# Patient Record
Sex: Male | Born: 1985 | Race: Black or African American | Hispanic: No | Marital: Married | Smoking: Never smoker
Health system: Southern US, Community
[De-identification: ages and names within clinical notes are randomized; demographics above are authoritative.]

---

## 2021-03-14 ENCOUNTER — Other Ambulatory Visit: Payer: Self-pay

## 2021-03-14 ENCOUNTER — Emergency Department: Payer: 59

## 2021-03-14 ENCOUNTER — Encounter: Payer: Self-pay | Admitting: Emergency Medicine

## 2021-03-14 ENCOUNTER — Emergency Department
Admission: EM | Admit: 2021-03-14 | Discharge: 2021-03-14 | Disposition: A | Discharge status: Home / Self Care | Payer: 59 | Attending: Emergency Medicine | Admitting: Emergency Medicine

## 2021-03-14 DIAGNOSIS — R109 Unspecified abdominal pain: Secondary | ICD-10-CM | POA: Diagnosis not present

## 2021-03-14 DIAGNOSIS — R52 Pain, unspecified: Secondary | ICD-10-CM

## 2021-03-14 DIAGNOSIS — M24852 Other specific joint derangements of left hip, not elsewhere classified: Secondary | ICD-10-CM | POA: Insufficient documentation

## 2021-03-14 DIAGNOSIS — M25552 Pain in left hip: Secondary | ICD-10-CM | POA: Diagnosis present

## 2021-03-14 DIAGNOSIS — M25852 Other specified joint disorders, left hip: Secondary | ICD-10-CM

## 2021-03-14 MED ORDER — OXYCODONE-ACETAMINOPHEN 7.5-325 MG PO TABS: 1.0000 | ORAL_TABLET | Freq: Four times a day (QID) | ORAL | 0 refills | Status: AC | PRN

## 2021-03-14 NOTE — ED Provider Notes (Signed)
[facility] Emergency Department Provider Note   ____________________________________________   Event Date/Time   First MD Initiated Contact with Patient 03/14/21 [phone]     (approximate)  I have reviewed the triage vital signs and the nursing notes.   HISTORY  Chief Complaint Leg Pain    HPI Jim Ross is a 36 y.o. male patient complain of radicular pain from the left hip to mid thigh.  Patient onset of complaint few weeks ago.  Patient was seen for back pain initially and given Medrol Dosepak which relieved the back pain.  Left hip and thigh pain continues.  Patient stated mild to moderate relief using a TENS unit that he borrowed from his sister.           History reviewed. No pertinent past medical history.  There are no problems to display for this patient.   History reviewed. No pertinent surgical history.  Prior to Admission medications   Medication Sig Start Date End Date Taking? Authorizing Provider  oxyCODONE-acetaminophen (PERCOCET) 7.5-325 MG tablet Take 1 tablet by mouth every 6 (six) hours as needed for up to 5 days. 03/14/21 03/19/21 Yes Joni Reining, PA-C    Allergies Patient has no known allergies.  No family history on file.  Social History Social History   Tobacco Use   Smoking status: Never   Smokeless tobacco: Never  Substance Use Topics   Alcohol use: Never   Drug use: Never    Review of Systems Constitutional: No fever/chills Eyes: No visual changes. ENT: No sore throat. Cardiovascular: Denies chest pain. Respiratory: Denies shortness of breath. Gastrointestinal: No abdominal pain.  No nausea, no vomiting.  No diarrhea.  No constipation. Genitourinary: Negative for dysuria. Musculoskeletal: Negative for back pain.  Left hip and upper leg pain. Skin: Negative for rash. Neurological: Negative for headaches, focal weakness or numbness.  ____________________________________________   PHYSICAL  EXAM:  VITAL SIGNS: ED Triage Vitals  Enc Vitals Group     BP 03/14/21 0640 122/81     Pulse Rate 03/14/21 0640 81     Resp 03/14/21 0640 18     Temp 03/14/21 0640 98.6 F (37 C)     Temp Source 03/14/21 0640 Oral     SpO2 03/14/21 0640 98 %     Weight 03/14/21 0643 270 lb (122.5 kg)     Height 03/14/21 0643 6\' 1"  (1.854 m)     Head Circumference --      Peak Flow --      Pain Score 03/14/21 0701 7     Pain Loc --      Pain Edu? --      Excl. in GC? --     Constitutional: Alert and oriented. Well appearing and in no acute distress. Cardiovascular: Normal rate, regular rhythm. Grossly normal heart sounds.  Good peripheral circulation. Respiratory: Normal respiratory effort.  No retractions. Lungs CTAB. Gastrointestinal: Soft and nontender. No distention. No abdominal bruits. No CVA tenderness. Genitourinary: Deferred Musculoskeletal: No obvious deformity to left lower extremity.  No ligament discrepancy.  Patient has moderate guarding palpation at the greater trochanter and mid area thigh.. Neurologic:  Normal speech and language. No gross focal neurologic deficits are appreciated. No gait instability. Skin:  Skin is warm, dry and intact. No rash noted. Psychiatric: Mood and affect are normal. Speech and behavior are normal.  ____________________________________________   LABS (all labs ordered are listed, but only abnormal results are displayed)  Labs Reviewed - No data to  display ____________________________________________  EKG   ____________________________________________  RADIOLOGY I, Joni Reining, personally viewed and evaluated these images (plain radiographs) as part of my medical decision making, as well as reviewing the written report by the radiologist.  ED MD interpretation: No obvious deformity on x-ray of the left hip.  Official radiology report(s): DG HIP UNILAT WITH PELVIS 2-3 VIEWS LEFT  Result Date: 03/14/2021 CLINICAL DATA:  Left hip and thigh  pain. EXAM: DG HIP (WITH OR WITHOUT PELVIS) 2-3V LEFT COMPARISON:  None. FINDINGS: Normal bone mineralization. Joint spaces are preserved. Punctate superior left acetabular os acetabuli, possibly developmental versus secondary to femoroacetabular impingement. There is slight increased prominence of the anterior superior left femoral head-neck junction. No acute fracture or dislocation. IMPRESSION: Slightly increased prominence of the left femoral head-neck junction mildly increases risk for CAM-type femoroacetabular impingement. Electronically Signed   By: Neita Garnet M.D.   On: 03/14/2021 08:39    ____________________________________________   PROCEDURES  Procedure(s) performed (including Critical Care):  Procedures   ____________________________________________   INITIAL IMPRESSION / ASSESSMENT AND PLAN / ED COURSE  As part of my medical decision making, I reviewed the following data within the electronic MEDICAL RECORD NUMBER         Patient presents with left hip and right flank pain for few weeks.  Discussed x-ray findings with patient suggestive of impingement.  Patient will be consulted to orthopedic for definitive evaluation and treatment.      ____________________________________________   FINAL CLINICAL IMPRESSION(S) / ED DIAGNOSES  Final diagnoses:  Pain aggravated by activities of daily living  Femoroacetabular impingement of left hip     ED Discharge Orders          Ordered    oxyCODONE-acetaminophen (PERCOCET) 7.5-325 MG tablet  Every 6 hours PRN        03/14/21 0907             Note:  This document was prepared using Dragon voice recognition software and may include unintentional dictation errors.    Joni Reining, PA-C 03/14/21 6834    Dionne Bucy, MD 03/14/21 1336

## 2021-03-14 NOTE — ED Triage Notes (Signed)
Pt to ED from home c/o left leg thigh pain for a couple weeks.  Denies known injury to thigh but did have back injury prior to leg hurting.

## 2021-03-14 NOTE — Discharge Instructions (Signed)
Read and follow discharge care instructions.  Take medication as needed.  Follow-up with orthopedic for definitive evaluation and treatment.  When calling the clinic tell them you are a follow-up from emergency room.

## 2023-01-13 IMAGING — CR DG HIP (WITH OR WITHOUT PELVIS) 2-3V*L*
1 series · 3 of 3 positions shown · non-contrast
Comparison: None.

CLINICAL DATA: Left hip and thigh pain.

EXAM:
DG HIP (WITH OR WITHOUT PELVIS) 2-3V LEFT

[Series 1: dg hip unilat w or w/o pelvis 2-3 views  · non-contrast · 0.14mm/px · 3 of 3 slices shown]
[im 1/3]
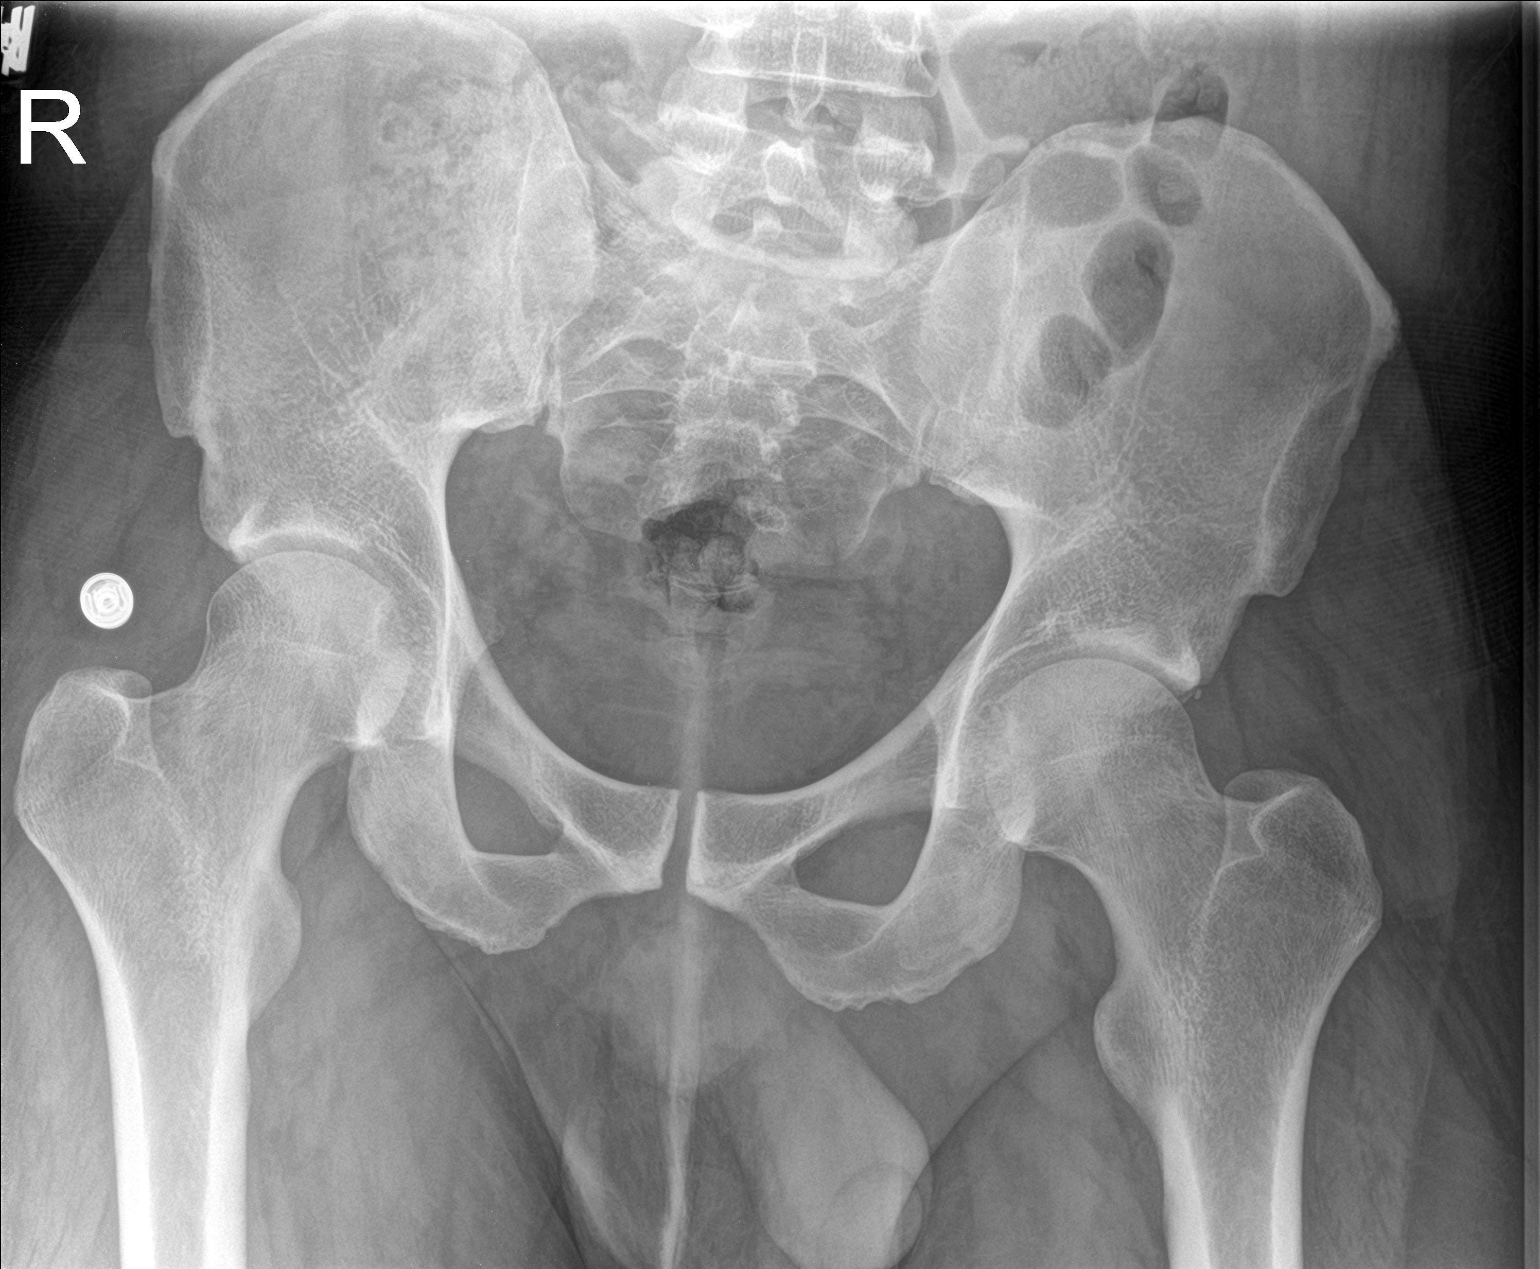
[im 2/3]
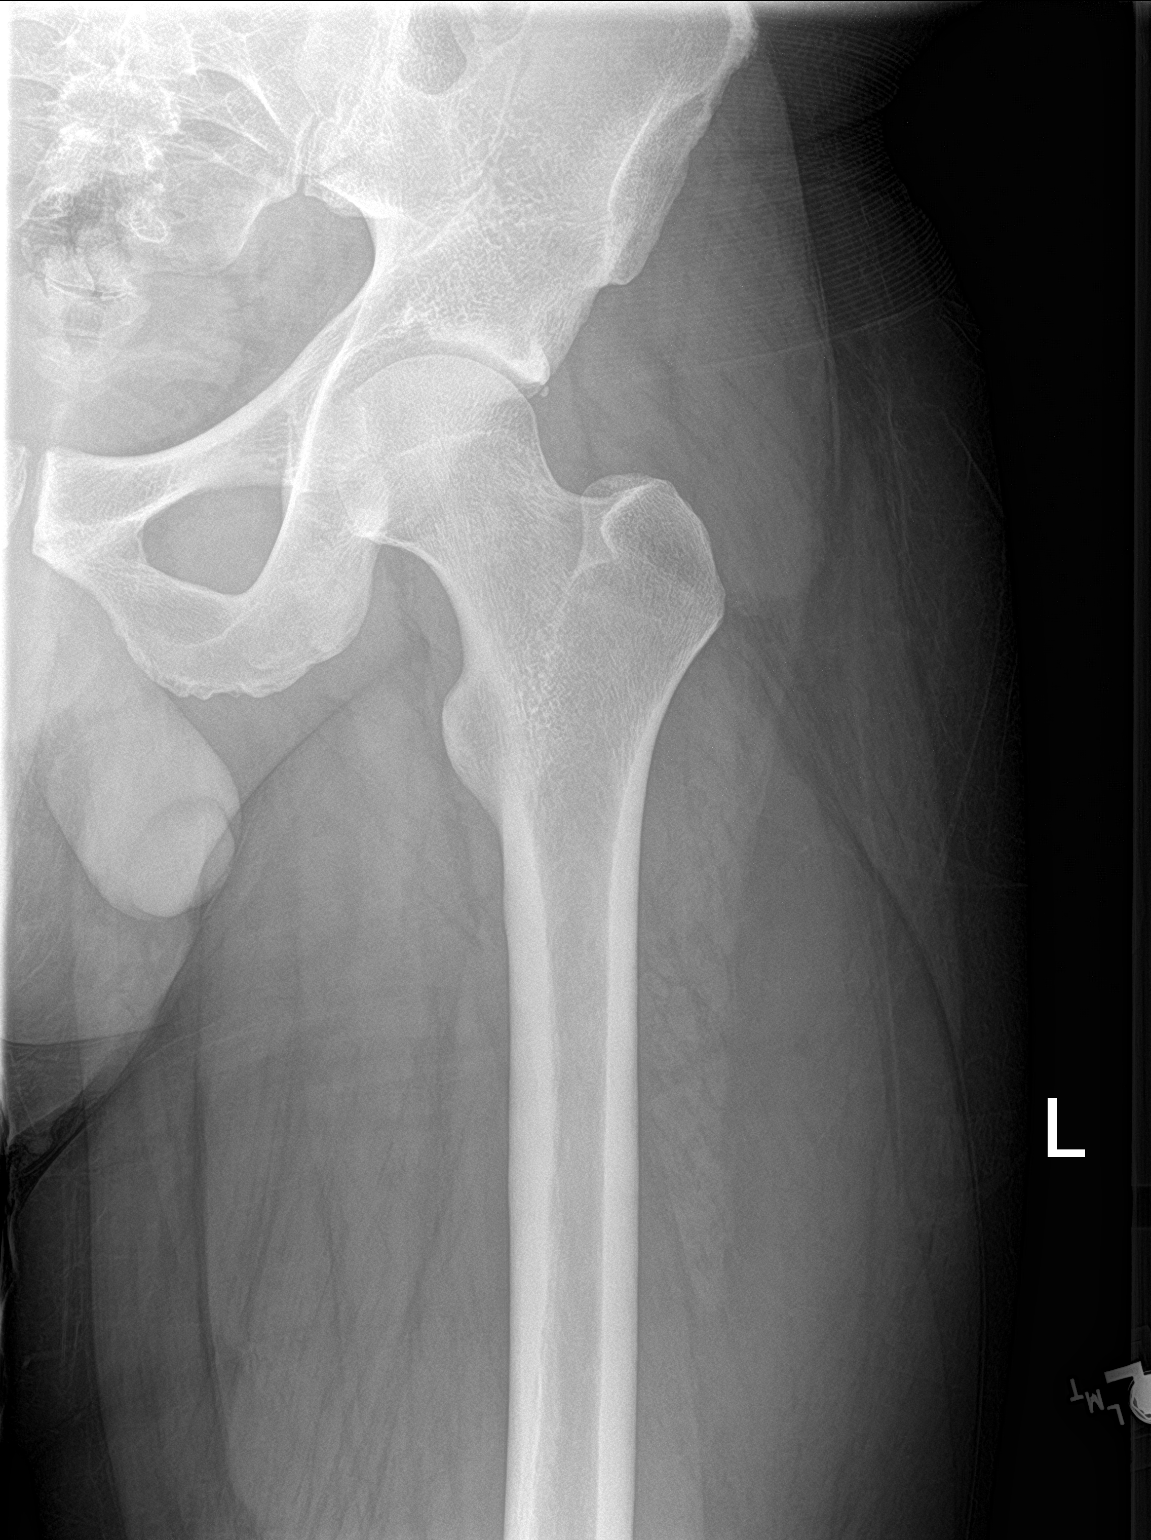
[im 3/3]
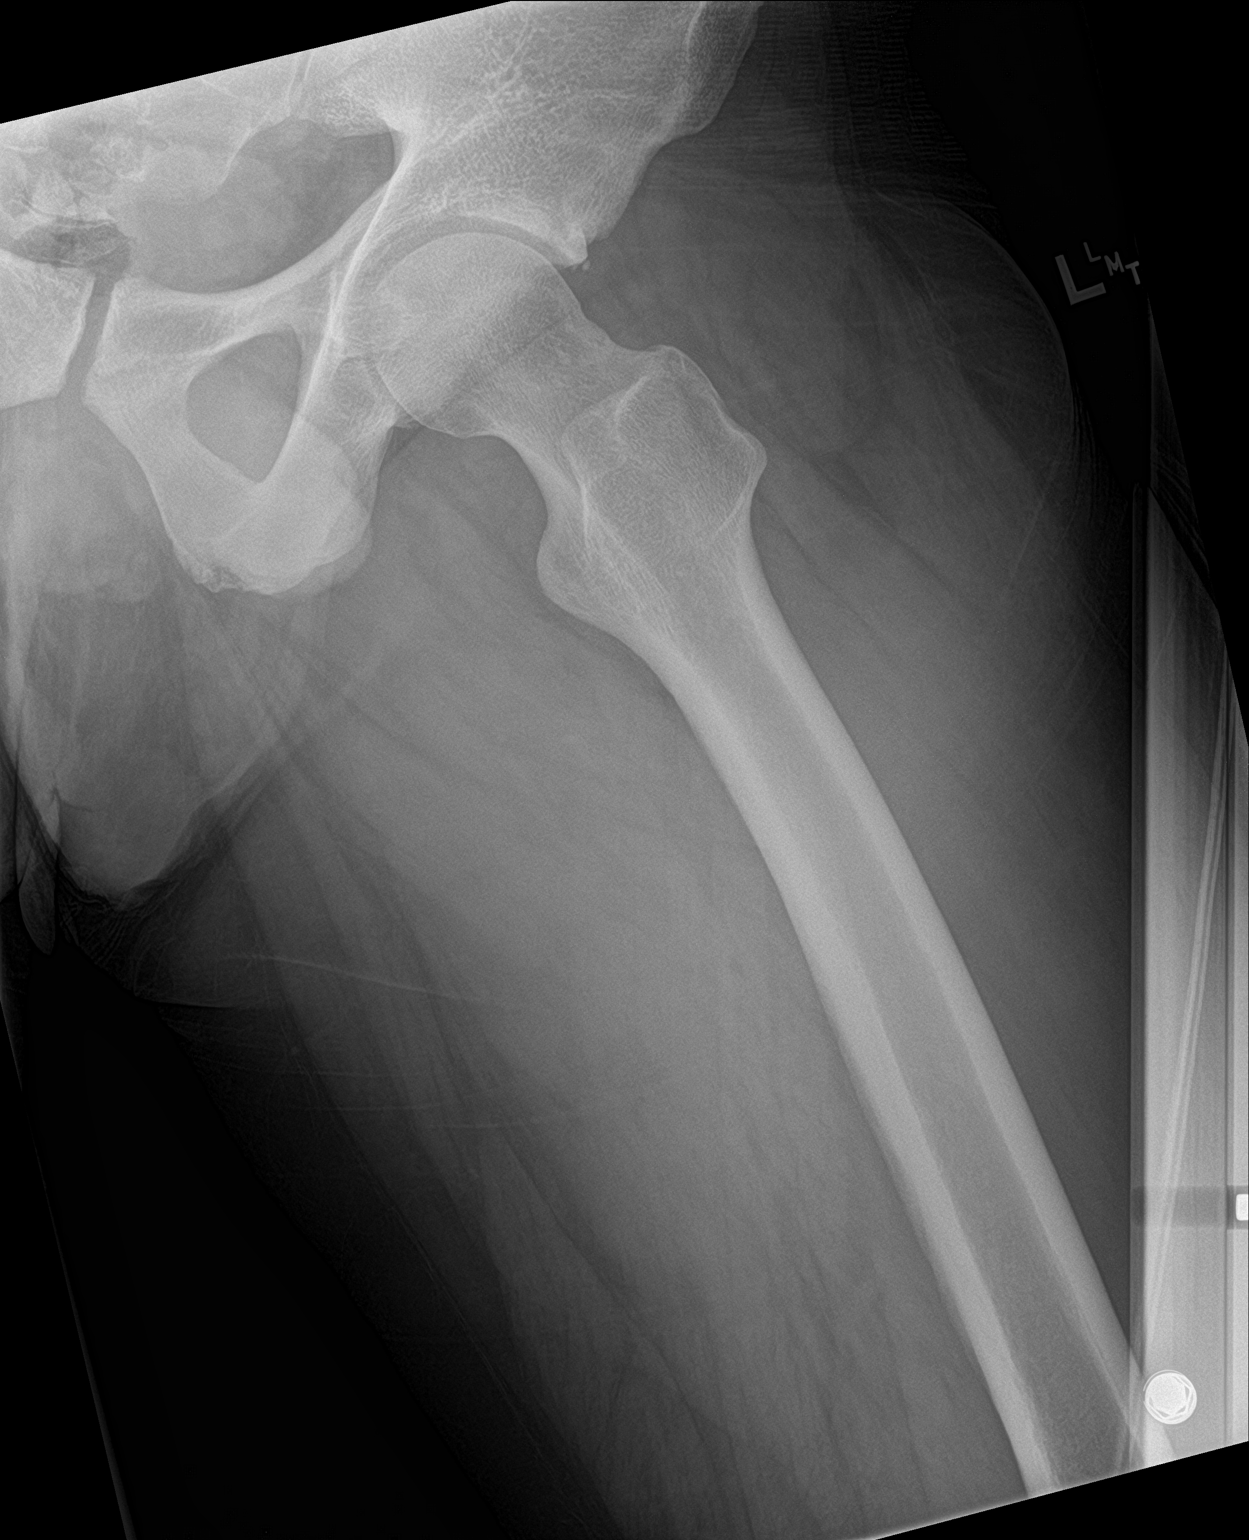

[3 of 3 positions shown; findings below may reference images not displayed]

FINDINGS: Normal bone mineralization. Joint spaces are preserved. Punctate
superior left acetabular os acetabuli, possibly developmental versus
secondary to femoroacetabular impingement. There is slight increased
prominence of the anterior superior left femoral head-neck junction.
No acute fracture or dislocation.
IMPRESSION: Slightly increased prominence of the left femoral head-neck junction
mildly increases risk for CAM-type femoroacetabular impingement.

## 2023-03-11 ENCOUNTER — Ambulatory Visit
Admission: EM | Admit: 2023-03-11 | Discharge: 2023-03-11 | Disposition: A | Discharge status: Home / Self Care | Payer: 59 | Attending: Emergency Medicine | Admitting: Emergency Medicine

## 2023-03-11 DIAGNOSIS — J069 Acute upper respiratory infection, unspecified: Secondary | ICD-10-CM | POA: Diagnosis not present

## 2023-03-11 LAB — POC COVID19/FLU A&B COMBO
Covid Antigen, POC: NEGATIVE
Influenza A Antigen, POC: NEGATIVE
Influenza B Antigen, POC: NEGATIVE

## 2023-03-11 LAB — POCT RAPID STREP A (OFFICE): Rapid Strep A Screen: NEGATIVE

## 2023-03-11 NOTE — Discharge Instructions (Signed)
Your symptoms today are most likely being caused by a virus and should steadily improve in time it can take up to 7 to 10 days before you truly start to see a turnaround however things will get better  COVID, flu and strep test negative    You can take Tylenol and/or Ibuprofen as needed for fever reduction and pain relief.   For cough: honey 1/2 to 1 teaspoon (you can dilute the honey in water or another fluid).  You can also use guaifenesin and dextromethorphan for cough. You can use a humidifier for chest congestion and cough.  If you don't have a humidifier, you can sit in the bathroom with the hot shower running.      For sore throat: try warm salt water gargles, cepacol lozenges, throat spray, warm tea or water with lemon/honey, popsicles or ice, or OTC cold relief medicine for throat discomfort.   For congestion: take a daily anti-histamine like Zyrtec, Claritin, and a oral decongestant, such as pseudoephedrine.  You can also use Flonase 1-2 sprays in each nostril daily.   It is important to stay hydrated: drink plenty of fluids (water, gatorade/powerade/pedialyte, juices, or teas) to keep your throat moisturized and help further relieve irritation/discomfort.  

## 2023-03-11 NOTE — ED Provider Notes (Signed)
 [name]    CSN: [id number] Arrival date & time: 03/11/23  1301      History   Chief Complaint No chief complaint on file.   HPI Jim Ross is a 38 y.o. male.   Patient presents for evaluation of a fever peaking at 99.5, mild nasal congestion, nonproductive cough, the sensation of mucus sitting within the throat, headache with pressure behind the bilateral eyes, generalized bodyaches and malaise present for 1 day. Wife Endorses increased effort to breathe overnight while sleeping.  Known sick contacts at work.  Tolerating food and liquids.  Has not attempted treatment.  Denies shortness of breath or wheezing.   No past medical history on file.  There are no active problems to display for this patient.   No past surgical history on file.     Home Medications    Prior to Admission medications   Not on File    Family History No family history on file.  Social History Social History   Tobacco Use   Smoking status: Never   Smokeless tobacco: Never  Substance Use Topics   Alcohol use: Never   Drug use: Never     Allergies   Patient has no known allergies.   Review of Systems Review of Systems   Physical Exam Triage Vital Signs ED Triage Vitals  Encounter Vitals Group     BP      Systolic BP Percentile      Diastolic BP Percentile      Pulse      Resp      Temp      Temp src      SpO2      Weight      Height      Head Circumference      Peak Flow      Pain Score      Pain Loc      Pain Education      Exclude from Growth Chart    No data found.  Updated Vital Signs There were no vitals taken for this visit.  Visual Acuity Right Eye Distance:   Left Eye Distance:   Bilateral Distance:    Right Eye Near:   Left Eye Near:    Bilateral Near:     Physical Exam Constitutional:      Appearance: Normal appearance.  HENT:     Head: Normocephalic.     Right Ear: Tympanic membrane, ear canal and external ear normal.      Left Ear: Tympanic membrane, ear canal and external ear normal.     Nose: Congestion present. No rhinorrhea.     Mouth/Throat:     Mouth: Mucous membranes are moist.     Pharynx: No oropharyngeal exudate or posterior oropharyngeal erythema.  Eyes:     Extraocular Movements: Extraocular movements intact.  Cardiovascular:     Rate and Rhythm: Normal rate and regular rhythm.     Pulses: Normal pulses.     Heart sounds: Normal heart sounds.  Pulmonary:     Effort: Pulmonary effort is normal.     Breath sounds: Normal breath sounds.  Musculoskeletal:     Cervical back: Normal range of motion and neck supple.  Neurological:     Mental Status: He is alert and oriented to person, place, and time. Mental status is at baseline.      UC Treatments / Results  Labs (all labs ordered are listed, but only abnormal results are displayed) Labs  Reviewed - No data to display  EKG   Radiology No results found.  Procedures Procedures (including critical care time)  Medications Ordered in UC Medications - No data to display  Initial Impression / Assessment and Plan / UC Course  I have reviewed the triage vital signs and the nursing notes.  Pertinent labs & imaging results that were available during my care of the patient were reviewed by me and considered in my medical decision making (see chart for details).  Viral URI with cough  Patient is in no signs of distress nor toxic appearing.  Vital signs are stable.  Low suspicion for pneumonia, pneumothorax or bronchitis and therefore will defer imaging.  COVID, flu and strep test negative. May use additional over-the-counter medications as needed for supportive care.  May follow-up with urgent care as needed if symptoms persist or worsen.  Note given.   Final Clinical Impressions(s) / UC Diagnoses   Final diagnoses:  None   Discharge Instructions   None    ED Prescriptions   None    PDMP not reviewed this encounter.   Teresa Shelba SAUNDERS, NP 03/11/23 1435

## 2023-03-11 NOTE — ED Triage Notes (Signed)
 Triaged by provider
